# Patient Record
Sex: Male | Born: 1970 | Race: Black or African American | Hispanic: No | Marital: Single | Smoking: Current some day smoker
Health system: Southern US, Community
[De-identification: ages and names within clinical notes are randomized; demographics above are authoritative.]

## PROBLEM LIST (undated history)

## (undated) DIAGNOSIS — E860 Dehydration: Secondary | ICD-10-CM

---

## 2016-01-26 ENCOUNTER — Emergency Department (HOSPITAL_COMMUNITY)
Admission: EM | Admit: 2016-01-26 | Discharge: 2016-01-26 | Disposition: A | Discharge status: Home / Self Care | Payer: PRIVATE HEALTH INSURANCE | Attending: Emergency Medicine | Admitting: Emergency Medicine

## 2016-01-26 ENCOUNTER — Encounter (HOSPITAL_COMMUNITY): Payer: Self-pay | Admitting: *Deleted

## 2016-01-26 ENCOUNTER — Emergency Department (HOSPITAL_COMMUNITY): Payer: PRIVATE HEALTH INSURANCE

## 2016-01-26 DIAGNOSIS — F17200 Nicotine dependence, unspecified, uncomplicated: Secondary | ICD-10-CM | POA: Insufficient documentation

## 2016-01-26 DIAGNOSIS — K047 Periapical abscess without sinus: Secondary | ICD-10-CM

## 2016-01-26 DIAGNOSIS — R220 Localized swelling, mass and lump, head: Secondary | ICD-10-CM | POA: Diagnosis present

## 2016-01-26 LAB — CBC WITH DIFFERENTIAL/PLATELET
Basophils Absolute: 0 K/uL (ref 0.0–0.1)
Basophils Relative: 0 %
Eosinophils Absolute: 0 K/uL (ref 0.0–0.7)
Eosinophils Relative: 0 %
HCT: 43.7 % (ref 39.0–52.0)
Hemoglobin: 14.5 g/dL (ref 13.0–17.0)
Lymphocytes Relative: 27 %
Lymphs Abs: 1.8 K/uL (ref 0.7–4.0)
MCH: 31.7 pg (ref 26.0–34.0)
MCHC: 33.2 g/dL (ref 30.0–36.0)
MCV: 95.6 fL (ref 78.0–100.0)
Monocytes Absolute: 0.9 K/uL (ref 0.1–1.0)
Monocytes Relative: 14 %
Neutro Abs: 3.8 K/uL (ref 1.7–7.7)
Neutrophils Relative %: 59 %
Platelets: 175 K/uL (ref 150–400)
RBC: 4.57 MIL/uL (ref 4.22–5.81)
RDW: 11.6 % (ref 11.5–15.5)
WBC: 6.5 K/uL (ref 4.0–10.5)

## 2016-01-26 LAB — I-STAT CHEM 8, ED
BUN: 10 mg/dL (ref 6–20)
Calcium, Ion: 1.09 mmol/L — ABNORMAL LOW (ref 1.12–1.23)
Chloride: 104 mmol/L (ref 101–111)
Creatinine, Ser: 1 mg/dL (ref 0.61–1.24)
Glucose, Bld: 79 mg/dL (ref 65–99)
HCT: 45 % (ref 39.0–52.0)
Hemoglobin: 15.3 g/dL (ref 13.0–17.0)
Potassium: 3.8 mmol/L (ref 3.5–5.1)
Sodium: 141 mmol/L (ref 135–145)
TCO2: 23 mmol/L (ref 0–100)

## 2016-01-26 MED ORDER — CLINDAMYCIN PHOSPHATE 600 MG/50ML IV SOLN
600.0000 mg | Freq: Once | INTRAVENOUS | Status: AC
  Administered 2016-01-26: 600 mg via INTRAVENOUS
  Filled 2016-01-26: qty 50

## 2016-01-26 MED ORDER — HYDROCODONE-ACETAMINOPHEN 5-325 MG PO TABS: 2.0000 | ORAL_TABLET | ORAL | Status: AC | PRN

## 2016-01-26 MED ORDER — KETOROLAC TROMETHAMINE 30 MG/ML IJ SOLN
30.0000 mg | Freq: Once | INTRAMUSCULAR | Status: AC
  Administered 2016-01-26: 30 mg via INTRAVENOUS
  Filled 2016-01-26: qty 1

## 2016-01-26 MED ORDER — MORPHINE SULFATE (PF) 4 MG/ML IV SOLN
4.0000 mg | Freq: Once | INTRAVENOUS | Status: AC
  Administered 2016-01-26: 4 mg via INTRAVENOUS
  Filled 2016-01-26: qty 1

## 2016-01-26 MED ORDER — IOPAMIDOL (ISOVUE-300) INJECTION 61%
INTRAVENOUS | Status: AC
  Administered 2016-01-26: 75 mL
  Filled 2016-01-26: qty 75

## 2016-01-26 MED ORDER — DEXAMETHASONE SODIUM PHOSPHATE 10 MG/ML IJ SOLN
20.0000 mg | Freq: Once | INTRAMUSCULAR | Status: AC
  Administered 2016-01-26: 20 mg via INTRAVENOUS
  Filled 2016-01-26: qty 2

## 2016-01-26 NOTE — ED Notes (Signed)
States  He noticed swelling to lower jaw on Monday was seen by his MD and started on antibiotics  Not getting better [facility] and was told to come to the ed

## 2016-01-26 NOTE — ED Provider Notes (Signed)
CSN: [id number]     Arrival date & time 01/26/16  1305 History   First MD Initiated Contact with Patient 01/26/16 1339     Chief Complaint  Patient presents with  . Abscess     (Consider location/radiation/quality/duration/timing/severity/associated sxs/prior Treatment) HPI   Alan Dyer is a 45 y.o M with no significant pmhx who presents to the ED today c/o left jaw swelling and pain. Pt states that 5 days ago he noticed that his left lower jaw was beginning to swell. It then became extremely painful. Pt also reports subjective fevers at home which he has been taking ibuprofen for. Pt states that he saw his PCP for this 3 days ago and was given Clindamycin. Pt states that he has taken 6 doses without relief of huis symptoms. Pt states that the are has gotten bigger and more painful since then. Pt states that he went to [facility] earlier today and was sent here for further evaluation. He denies dysphagia, ST, otalgia, rhinorrhea. Pt has not had recent dental work.   History reviewed. No pertinent past medical history. History reviewed. No pertinent past surgical history. History reviewed. No pertinent family history. Social History  Substance Use Topics  . Smoking status: Current Some Day Smoker  . Smokeless tobacco: None  . Alcohol Use: Yes    Review of Systems  All other systems reviewed and are negative.     Allergies  Review of patient's allergies indicates not on file.  Home Medications   Prior to Admission medications   Not on File   BP 119/88 mmHg  Pulse 85  Temp(Src) 98.2 F (36.8 C) (Oral)  Resp 14  Ht  (1.753 m)  Wt 96.616 kg  BMI 31.44 kg/m2  SpO2 96% Physical Exam  Constitutional: He is oriented to person, place, and time. He appears well-developed and well-nourished. No distress.  HENT:  Head: Normocephalic and atraumatic.  Swelling along left mandibular line with TTP. No fluctuance or drainage. No obvious dental abscess. No trismus. No tonsilar  exudate. No PTA. Uvula midline.   Eyes: Conjunctivae are normal. Right eye exhibits no discharge. Left eye exhibits no discharge. No scleral icterus.  Neck: Neck supple.  Cardiovascular: Normal rate.   Pulmonary/Chest: Effort normal.  Lymphadenopathy:    He has no cervical adenopathy.  Neurological: He is alert and oriented to person, place, and time. Coordination normal.  Skin: Skin is warm and dry. No rash noted. He is not diaphoretic. No erythema. No pallor.  Psychiatric: He has a normal mood and affect. His behavior is normal.  Nursing note and vitals reviewed.   ED Course  Procedures (including critical care time) Labs Review Labs Reviewed  I-STAT CHEM 8, ED - Abnormal; Notable for the following:    Calcium, Ion 1.09 (*)    All other components within normal limits  CBC WITH DIFFERENTIAL/PLATELET    Imaging Review Ct Maxillofacial W/cm  01/26/2016  CLINICAL DATA:  Left jaw swelling for several days, initial encounter EXAM: CT MAXILLOFACIAL WITH CONTRAST TECHNIQUE: Multidetector CT imaging of the maxillofacial structures was performed with intravenous contrast. Multiplanar CT image reconstructions were also generated. A small metallic BB was placed on the right temple in order to reliably differentiate right from left. CONTRAST:  Seventy-five 100 mL ISOVUE-300 IOPAMIDOL (ISOVUE-300) INJECTION 61% COMPARISON:  None. FINDINGS: Bony structures show no acute fracture. There is lucency surrounding the root of the first bicuspid on the left in the mandible. Subcutaneous edema and soft tissue swelling is noted  in this region. This likely represents localized infection. No focal abscess is seen. The orbits and their contents are within normal limits. Paranasal sinuses are unremarkable. No other focal abnormality is seen. IMPRESSION: Lucency surrounding the root of a left mandibular first bicuspid. This likely represents a periapical abscess with some adjacent soft tissue involvement. No  subcutaneous abscess is seen. No other focal abnormality is noted. Electronically Signed   By: Alcide CleverMark  Lukens M.D.   On: 01/26/2016 15:42   I have personally reviewed and evaluated these images and lab results as part of my medical decision-making.   EKG Interpretation None      MDM   Final diagnoses:  Periapical abscess   Otherwise healthy 45 year old male presents the ED complaining of left lower jaw pain and swelling onset a few days ago. He has been taking home clindamycin without improvement. He has taken 2 days worth of the antibiotics. On presentation to ED patient is afebrile and is nontoxic, nonseptic appearing. There is swelling noted to his left mandible but no obvious abscess seen. Patient was given IV clindamycin, Decadron and pain medication with significant symptomatic improvement. CT maxillofacial obtained which reveals lucency surrounding the root of a left mandibular first bicuspid which likely represents a periapical abscess. No focal abscess seen on CT that could be drained by me. No leukocytosis. All lab work within normal limits. Unfortunately it is a holiday weekend and there are no dentists on call. They will not be open for another 3 days as Memorial Day is on Monday. Patient has only been taking 2 days with of his antibiotics so technically he is not quite failed outpatient in about therapy as it would require 3 days with no improvement of symptoms. I discussed this with patient and told him that if by tomorrow after his third day of antibiotics if he did not see any symptomatic improvement to please return to the ED for reevaluation as he will not be able to get in to see a dentist at this time. Otherwise, he may follow-up with dentist on Tuesday when the office opens. Encourage patient to continue taking home clindamycin. Will prescribe pain medications.  I discussed this with Dr. Cecille PoMeisner who agrees with the treatment plan.     Lester KinsmanSamantha Tripp KremlinDowless, PA-C 01/26/16  1958  Glynn OctaveStephen Rancour, MD 01/26/16 2138

## 2016-01-26 NOTE — ED Notes (Signed)
Declined W/C at D/C and was escorted to lobby by RN. 

## 2016-01-26 NOTE — ED Notes (Signed)
Patient transported to CT 

## 2016-01-26 NOTE — Discharge Instructions (Signed)
Dental Abscess A dental abscess is a collection of pus in or around a tooth. CAUSES This condition is caused by a bacterial infection around the root of the tooth that involves the inner part of the tooth (pulp). It may result from:  Severe tooth decay.  Trauma to the tooth that allows bacteria to enter into the pulp, such as a broken or chipped tooth.  Severe gum disease around a tooth. SYMPTOMS Symptoms of this condition include:  Severe pain in and around the infected tooth.  Swelling and redness around the infected tooth, in the mouth, or in the face.  Tenderness.  Pus drainage.  Bad breath.  Bitter taste in the mouth.  Difficulty swallowing.  Difficulty opening the mouth.  Nausea.  Vomiting.  Chills.  Swollen neck glands.  Fever. DIAGNOSIS This condition is diagnosed with examination of the infected tooth. During the exam, your dentist may tap on the infected tooth. Your dentist will also ask about your medical and dental history and may order X-rays. TREATMENT This condition is treated by eliminating the infection. This may be done with:  Antibiotic medicine.  A root canal. This may be performed to save the tooth.  Pulling (extracting) the tooth. This may also involve draining the abscess. This is done if the tooth cannot be saved. HOME CARE INSTRUCTIONS  Take medicines only as directed by your dentist.  If you were prescribed antibiotic medicine, finish all of it even if you start to feel better.  Rinse your mouth (gargle) often with salt water to relieve pain or swelling.  Do not drive or operate heavy machinery while taking pain medicine.  Do not apply heat to the outside of your mouth.  Keep all follow-up visits as directed by your dentist. This is important. SEEK MEDICAL CARE IF:  Your pain is worse and is not helped by medicine. SEEK IMMEDIATE MEDICAL CARE IF:  You have a fever or chills.  Your symptoms suddenly get worse.  You have a  very bad headache.  You have problems breathing or swallowing.  You have trouble opening your mouth.  You have swelling in your neck or around your eye.   This information is not intended to replace advice given to you by your health care provider. Make sure you discuss any questions you have with your health care provider.   Follow-up with a dentist as soon as possible for reevaluation. The earliest you will be able to see one is Tuesday as it is a holiday weekend. Continue taking her home antibiotics. Take pain medication as needed. Please return to the emergency department in 2 days if your symptoms have worsened in any way or have they have not improved. You will require a reevaluation and possible antibiotic change if your symptoms have remained unimproved. Return to the ED sooner if you experience fevers, chills, difficulty breathing, difficulty swallowing, swelling of lip or tongue.

## 2016-01-28 ENCOUNTER — Emergency Department (HOSPITAL_COMMUNITY)
Admission: EM | Admit: 2016-01-28 | Discharge: 2016-01-28 | Disposition: A | Discharge status: Home / Self Care | Payer: PRIVATE HEALTH INSURANCE | Attending: Emergency Medicine | Admitting: Emergency Medicine

## 2016-01-28 ENCOUNTER — Encounter (HOSPITAL_COMMUNITY): Payer: Self-pay | Admitting: Emergency Medicine

## 2016-01-28 DIAGNOSIS — K0889 Other specified disorders of teeth and supporting structures: Secondary | ICD-10-CM | POA: Diagnosis present

## 2016-01-28 DIAGNOSIS — K047 Periapical abscess without sinus: Secondary | ICD-10-CM | POA: Diagnosis not present

## 2016-01-28 DIAGNOSIS — Z880 Allergy status to penicillin: Secondary | ICD-10-CM | POA: Diagnosis not present

## 2016-01-28 DIAGNOSIS — F17200 Nicotine dependence, unspecified, uncomplicated: Secondary | ICD-10-CM | POA: Diagnosis not present

## 2016-01-28 MED ORDER — OXYCODONE-ACETAMINOPHEN 5-325 MG PO TABS: 1.0000 | ORAL_TABLET | ORAL | Status: AC | PRN

## 2016-01-28 MED ORDER — OXYCODONE-ACETAMINOPHEN 5-325 MG PO TABS
1.0000 | ORAL_TABLET | Freq: Once | ORAL | Status: AC
  Administered 2016-01-28: 1 via ORAL
  Filled 2016-01-28: qty 1

## 2016-01-28 MED ORDER — OXYCODONE-ACETAMINOPHEN 7.5-325 MG PO TABS: 1.0000 | ORAL_TABLET | Freq: Once | ORAL | Status: DC

## 2016-01-28 NOTE — ED Notes (Addendum)
Pt c/o left lower toothache onset last Monday. Pt seen here Saturday for same. Pt reports prescription medication not working. Pt also reports heartburn after eating catfish and macaroni and cheese yesterday. Pt reports drank ginger ale with relief.

## 2016-01-28 NOTE — Discharge Instructions (Signed)
Dental Abscess °A dental abscess is a collection of pus in or around a tooth. °CAUSES °This condition is caused by a bacterial infection around the root of the tooth that involves the inner part of the tooth (pulp). It may result from: °· Severe tooth decay. °· Trauma to the tooth that allows bacteria to enter into the pulp, such as a broken or chipped tooth. °· Severe gum disease around a tooth. °SYMPTOMS °Symptoms of this condition include: °· Severe pain in and around the infected tooth. °· Swelling and redness around the infected tooth, in the mouth, or in the face. °· Tenderness. °· Pus drainage. °· Bad breath. °· Bitter taste in the mouth. °· Difficulty swallowing. °· Difficulty opening the mouth. °· Nausea. °· Vomiting. °· Chills. °· Swollen neck glands. °· Fever. °DIAGNOSIS °This condition is diagnosed with examination of the infected tooth. During the exam, your dentist may tap on the infected tooth. Your dentist will also ask about your medical and dental history and may order X-rays. °TREATMENT °This condition is treated by eliminating the infection. This may be done with: °· Antibiotic medicine. °· A root canal. This may be performed to save the tooth. °· Pulling (extracting) the tooth. This may also involve draining the abscess. This is done if the tooth cannot be saved. °HOME CARE INSTRUCTIONS °· Take medicines only as directed by your dentist. °· If you were prescribed antibiotic medicine, finish all of it even if you start to feel better. °· Rinse your mouth (gargle) often with salt water to relieve pain or swelling. °· Do not drive or operate heavy machinery while taking pain medicine. °· Do not apply heat to the outside of your mouth. °· Keep all follow-up visits as directed by your dentist. This is important. °SEEK MEDICAL CARE IF: °· Your pain is worse and is not helped by medicine. °SEEK IMMEDIATE MEDICAL CARE IF: °· You have a fever or chills. °· Your symptoms suddenly get worse. °· You have a  very bad headache. °· You have problems breathing or swallowing. °· You have trouble opening your mouth. °· You have swelling in your neck or around your eye. °  °This information is not intended to replace advice given to you by your health care provider. Make sure you discuss any questions you have with your health care provider. °  °Document Released: 08/18/2005 Document Revised: 01/02/2015 Document Reviewed: 08/15/2014 °Elsevier Interactive Patient Education ©2016 Elsevier Inc. °Community Resource Guide Dental °The United Way’s “211” is a great source of information about community services available.  Access by dialing 2-1-1 from anywhere in [place], or by website -  www.nc211.org.  ° °Other Local Resources (Updated 09/2015) ° °Dental  Care °  °Services ° °  °Phone Number and Address  °Cost  °[facility] For children 0 - 21 years of age:  °• Cleaning °• Tooth brushing/flossing instruction °• Sealants, fillings, crowns °• Extractions °• Emergency treatment  [phone] °[address] °[place], [place] [address] Charges based on family income.  Medicaid and some insurance plans accepted.   °  °[facility] [place] • Cleaning °• Sealants, fillings, crowns °• Extractions °• Emergency treatment [phone] °[address] °[place], [place] ° Pregnant women 18 years of age or older with a Medicaid card  °[facility] [place] • Cleaning °• Sealants, fillings, crowns °• Extractions °• Emergency treatment [phone] °[address] °[place], [place] Pregnant   women 18 years of age or older with a Medicaid card  °[facility] For children 0 - 21 years of age:  °• Cleaning °• Tooth brushing/flossing instruction °• Sealants, fillings, crowns °• Extractions °• Emergency treatment °Limited orthodontic services for patients with Medicaid [phone] °[address] °[place], [place] [address] Medicaid and [place] Health Choice cover for children up to age 21 and pregnant women.  Parents of children up to age 21 without Medicaid pay a reduced fee at time of service.  °[facility] [place] For children 0 - 21 years of age:  °• Cleaning °• Tooth brushing/flossing instruction °• Sealants, fillings, crowns °• Extractions °• Emergency treatment °Limited orthodontic services for patients with Medicaid [phone] °[address] °[place], [place].  Medicaid and [place] Health Choice cover for children up to age 21 and pregnant women.  Parents of children up to age 21 without Medicaid pay a reduced fee.  °[facility] • Cleaning °• Sealants, fillings, crowns °• Extractions ° °Hours: Tuesdays and Thursdays, 4:15 - 8 pm [phone] °[address] °[place], [place] [address] Services free of charge to  County residents ages 18-64 who do not have health insurance, Medicare, Medicaid, or VA benefits and fall within federal poverty guidelines  °[facility] ° ° ° Provides dental care in addition to primary medical care, nutritional counseling, and pharmacy: °• Cleaning °• Sealants, fillings, crowns °• Extractions ° ° ° ° ° ° ° ° ° ° ° ° ° ° ° ° ° [phone] °[facility], [address] °[place], [place] ° °[phone] °[facility], [address] [place], [place] ° °[phone] °[facility] °[place], [place] ° °[phone] °[facility], [address] °, [place] ° °[phone] °[facility] °[address] °[place], [place] Accepts Medicaid, Medicare, most insurance.  Also provides services available to all with fees adjusted based on ability to pay.    °[facility] • Cleaning °• Tooth brushing/flossing  instruction °• Sealants, fillings, crowns °• Extractions °• Emergency treatment °Hours: Tuesdays, Thursdays, and Fridays from 8 am to 5 pm by appointment only. [phone] °[address] °[place], [place] [address] [place] residents with Medicaid (depending on eligibility) and children with [place] Health Choice - call for more information.  °[facility] • Extractions only ° °Hours: 2nd and 4th Thursday of each month from 6:30 am - 9 am.   [phone] ext. [phone] °[address] °[place],  [address] Ages 18 and older only.  Patients are seen on a first come, first served basis.  °[facility] • Cleanings °• Fillings °• Extractions °• Orthodontics °• Endodontics °• Implants/Crowns/Bridges °• Complete and partial dentures [phone] °[place],  Patients must complete an application for services.  There is often a waiting list.   ° °

## 2016-01-28 NOTE — ED Notes (Signed)
Declined W/C at D/C and was escorted to lobby by RN. 

## 2016-01-28 NOTE — ED Provider Notes (Signed)
CSN: [id number]     Arrival date & time 01/28/16  4098 History   First MD Initiated Contact with Patient 01/28/16 [phone]     Chief Complaint  Patient presents with  . Dental Pain   HPI   45 year old male presents to the ED with complaints of dental abscess. Patient was seen on 527 in the emergency room after 5 days of swelling. Patient had been seen by his primary care was started on clindamycin, was seen in the ED had a CT scan that showed soft tissue swelling, no abscess. Patient has continued to use clindamycin, pain medication prescribed here in the ED. Patient reports that the swelling has decreased, but notes that he has worsening swelling over the tooth. Patient denies any fever, nausea, vomiting, swelling of the tongue or throat. Patient has not had any recent dental work.  History reviewed. No pertinent past medical history. History reviewed. No pertinent past surgical history. No family history on file. Social History  Substance Use Topics  . Smoking status: Current Some Day Smoker  . Smokeless tobacco: None  . Alcohol Use: Yes    Review of Systems  All other systems reviewed and are negative.   Allergies  Penicillins  Home Medications   Prior to Admission medications   Medication Sig Start Date End Date Taking? Authorizing Provider  HYDROcodone-acetaminophen (NORCO/VICODIN) 5-325 MG tablet Take 2 tablets by mouth every 4 (four) hours as needed. 01/26/16   Samantha Tripp Dowless, PA-C  oxyCODONE-acetaminophen (PERCOCET) 5-325 MG tablet Take 1 tablet by mouth every 4 (four) hours as needed for severe pain. 01/28/16   Kamela Blansett, PA-C   BP 109/63 mmHg  Pulse 63  Temp(Src) 98.8 F (37.1 C) (Oral)  Resp 16  Ht  (1.753 m)  Wt 96.616 kg  BMI 31.44 kg/m2  SpO2 100% Physical Exam  Constitutional: He is oriented to person, place, and time. He appears well-developed and well-nourished. No distress.  HENT:  Head: Normocephalic.  Mouth/Throat: Uvula is midline,  oropharynx is clear and moist and mucous membranes are normal. No oropharyngeal exudate, posterior oropharyngeal edema, posterior oropharyngeal erythema or tonsillar abscesses.  Dental abscess located on the mandible left side. Full active range of motion of the jaw. Neck is supple with full active range of motion, no tenderness to palpation of the soft tissues  Posterior oropharynx clear with no signs of infection, uvula is midline and rises with phonation, tonsils present and normal in size, symmetrical bilateral, tongue is normal soft touch with full active range of motion, floor mouth is soft nontender.  Eyes: Conjunctivae are normal. Pupils are equal, round, and reactive to light. Right eye exhibits no discharge. Left eye exhibits no discharge.  Neck: Normal range of motion. Neck supple. No JVD present. No tracheal deviation present. No thyromegaly present.  Pulmonary/Chest: No stridor.  Lymphadenopathy:    He has no cervical adenopathy.  Neurological: He is alert and oriented to person, place, and time.  Skin: Skin is warm and dry. No rash noted. He is not diaphoretic. No erythema. No pallor.  Psychiatric: He has a normal mood and affect. His behavior is normal. Judgment and thought content normal.  Nursing note and vitals reviewed.   ED Course  Procedures (including critical care time)  EMERGENCY DEPARTMENT US SOFT TISSUE INTERPRETATION "Study: Limited Ultrasound of the noted body part in comments below"  INDICATIONS: Soft tissue infection Multiple views of the body part are obtained with a multi-frequency linear probe  PERFORMED BY:  Myself  IMAGES ARCHIVED?: Yes  SIDE:Left  BODY PART:Other soft tisse (comment in note)  FINDINGS: Abcess present  LIMITATIONS:  Body Habitus  INTERPRETATION:  Abcess present  COMMENT:  Abscess noted  NERVE BLOCK Performed by: Thermon LeylandHedges,Davelle Anselmi Todd Consent: Verbal consent obtained. Required items: required blood products, implants,  devices, and special equipment available Time out: Immediately prior to procedure a "time out" was called to verify the correct patient, procedure, equipment, support staff and site/side marked as required.  Indication: Dental abscess  Nerve block body site: Inferior alveolar left   Preparation: Patient was prepped and draped in the usual sterile fashion. Needle gauge: 24 G Location technique: anatomical landmarks  Local anesthetic: Bupivacaine   Anesthetic total: 1.8 ml  Outcome: pain improved Patient tolerance: Patient tolerated the procedure well with no immediate complications.  INCISION AND DRAINAGE Performed by: Thermon LeylandHedges,Kaci Freel Todd Consent: Verbal consent obtained. Risks and benefits: risks, benefits and alternatives were discussed Type: abscess  Body area: Left mandible  Anesthesia: local infiltration  Incision was made with a scalpel.  Local anesthetic: Bupivacaine % 0 epinephrine  Anesthetic total: 1.8 ml  Complexity: complex Blunt dissection to break up loculations  Drainage: purulent  Drainage amount: 2 mL    Patient tolerance: Patient tolerated the procedure well with no immediate complications.   Labs Review Labs Reviewed - No data to display  Imaging Review Ct Maxillofacial W/cm  01/26/2016  CLINICAL DATA:  Left jaw swelling for several days, initial encounter EXAM: CT MAXILLOFACIAL WITH CONTRAST TECHNIQUE: Multidetector CT imaging of the maxillofacial structures was performed with intravenous contrast. Multiplanar CT image reconstructions were also generated. A small metallic BB was placed on the right temple in order to reliably differentiate right from left. CONTRAST:  Seventy-five 100 mL ISOVUE-300 IOPAMIDOL (ISOVUE-300) INJECTION 61% COMPARISON:  None. FINDINGS: Bony structures show no acute fracture. There is lucency surrounding the root of the first bicuspid on the left in the mandible. Subcutaneous edema and soft tissue swelling is noted in this  region. This likely represents localized infection. No focal abscess is seen. The orbits and their contents are within normal limits. Paranasal sinuses are unremarkable. No other focal abnormality is seen. IMPRESSION: Lucency surrounding the root of a left mandibular first bicuspid. This likely represents a periapical abscess with some adjacent soft tissue involvement. No subcutaneous abscess is seen. No other focal abnormality is noted. Electronically Signed   By: Alcide CleverMark  Lukens M.D.   On: 01/26/2016 15:42   I have personally reviewed and evaluated these images and lab results as part of my medical decision-making.   EKG Interpretation None      MDM   Final diagnoses:  Pain, dental  Dental abscess    Labs:  Imaging:  Consults:  Therapeutics:  Discharge Meds: Continue clindamycin  Assessment/Plan:  Patient presents with dental abscess. Patient has CT scan showed no appreciable abscess, bedside ultrasound shows drainable abscess. Upon performed, abscess drained with purulent discharge. Patient improved here with above medications and I&D. He is instructed to continue his clindamycin, pain medication as needed. He is instructed to follow-up with the dentist as previously planned, he is given strict return precautions, verbalized understanding and agreement today's plan had no further questions or concerns at time of discharge        Eyvonne MechanicJeffrey Adama Ferber, PA-C 01/28/16 1035  Rolland PorterMark James, MD 02/07/16 1750

## 2016-12-30 IMAGING — CT CT MAXILLOFACIAL W/ CM
3 series · 16 of 47 positions shown, 19 images · IV contrast (iopamidol)
Comparison: None.

CLINICAL DATA: Left jaw swelling for several days, initial
encounter

EXAM:
CT MAXILLOFACIAL WITH CONTRAST
TECHNIQUE: Multidetector CT imaging of the maxillofacial structures was
performed with intravenous contrast. Multiplanar CT image
reconstructions were also generated. A small metallic BB was placed
on the right temple in order to reliably differentiate right from
left.
CONTRAST:  Seventy-five 100 mL JVBI5J-ATT IOPAMIDOL (JVBI5J-ATT)
INJECTION 61%

[Series 2: facial/ orbits 2.0 h30s · axial · 0.39mm/px · z∈[+554,+732]mm · 10 of 105 slices shown, 13 images]
[im 8/105  brain]
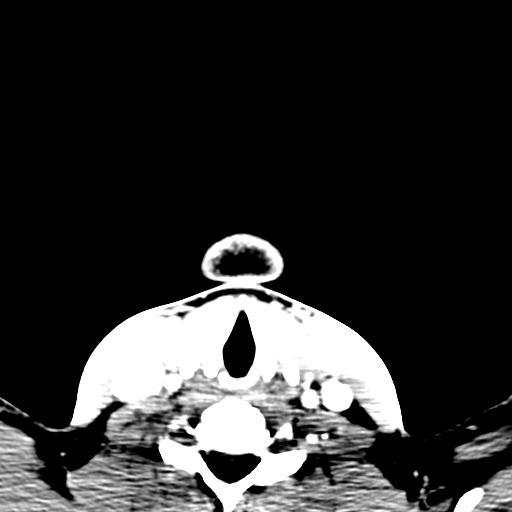
[im 8/105  bone]
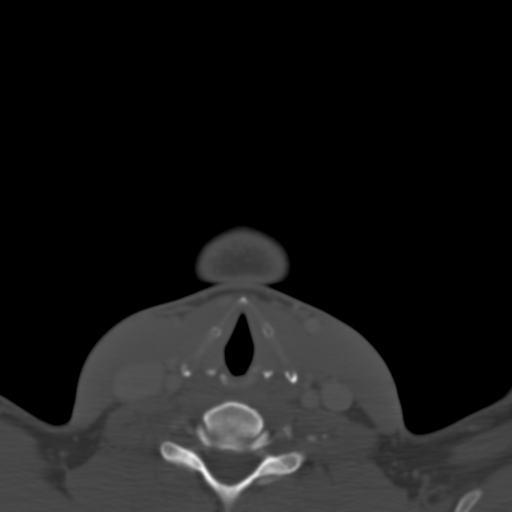
[im 18/105  bone]
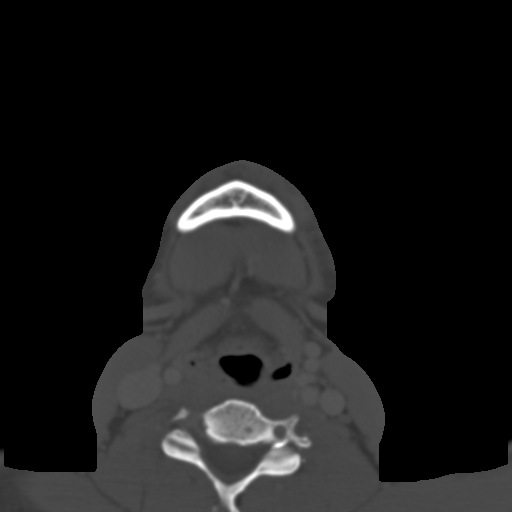
[im 29/105  bone]
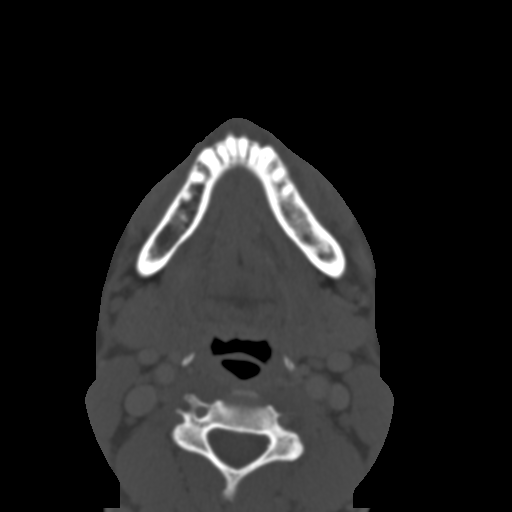
[im 36/105  bone]
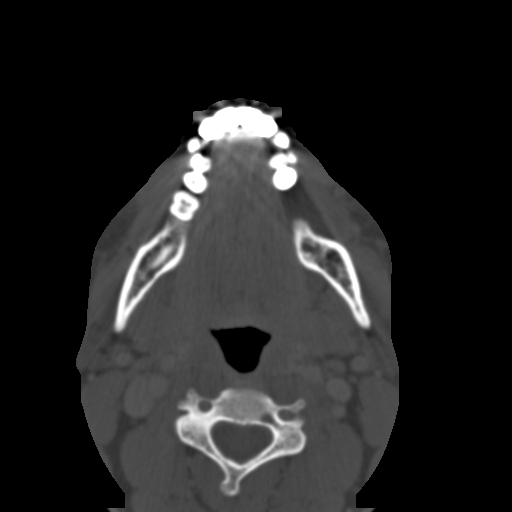
[im 47/105  brain]
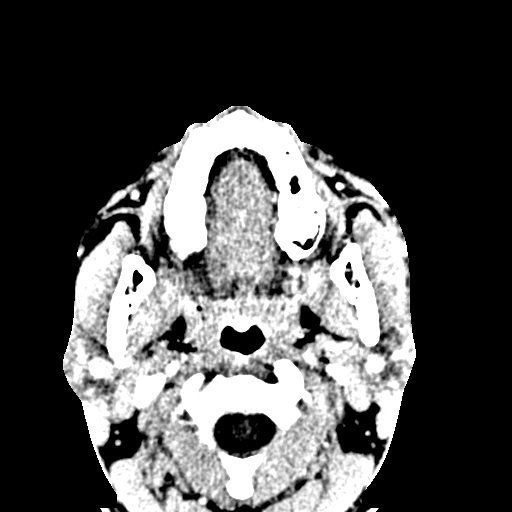
[im 47/105  bone]
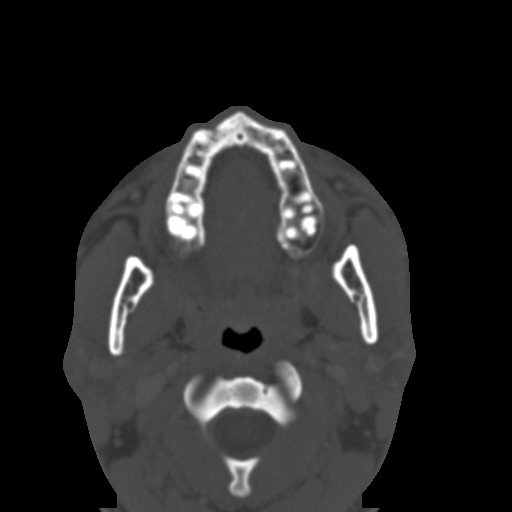
[im 58/105  bone]
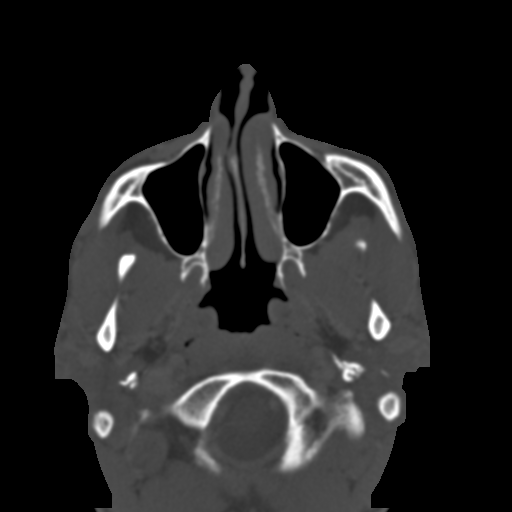
[im 69/105  bone]
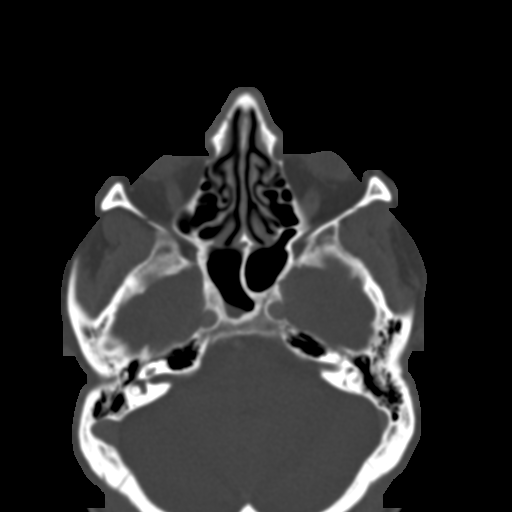
[im 79/105  bone]
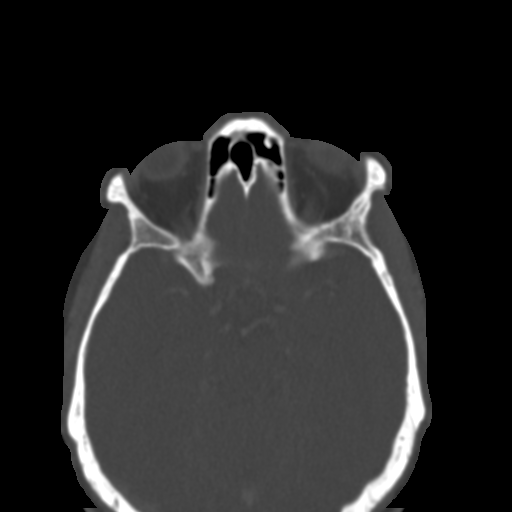
[im 87/105  brain]
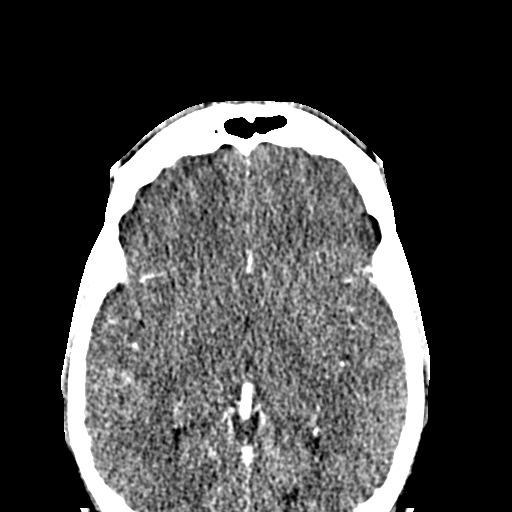
[im 87/105  bone]
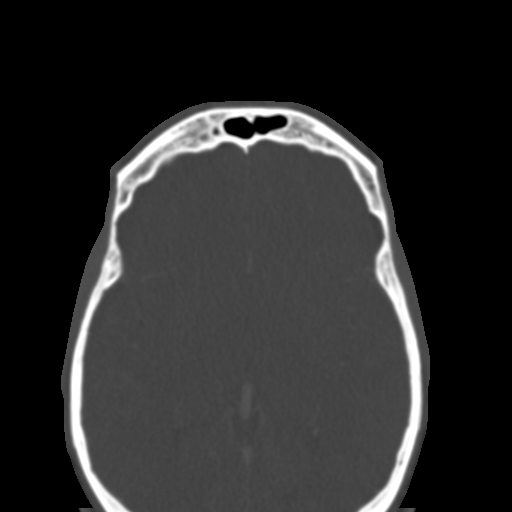
[im 97/105  bone]
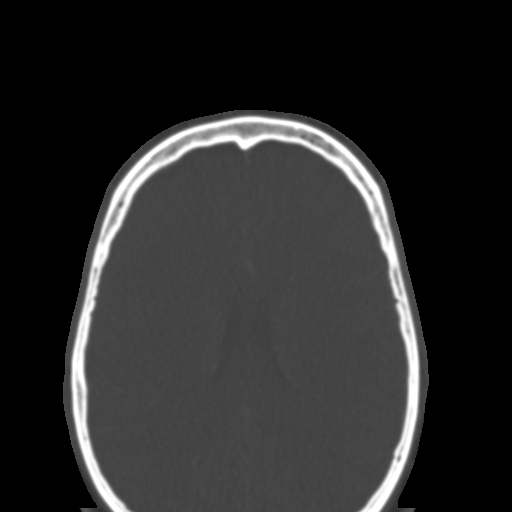

[Series 6: coronal soft tissue · coronal · 0.38mm/px · 3 of 97 slices shown]
[im 33/97  bone]
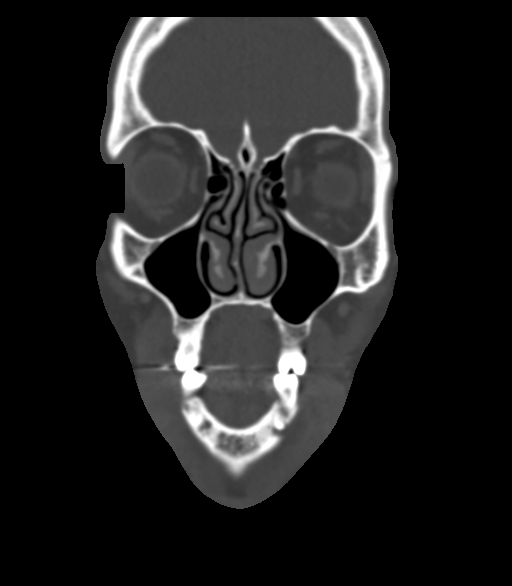
[im 43/97  bone]
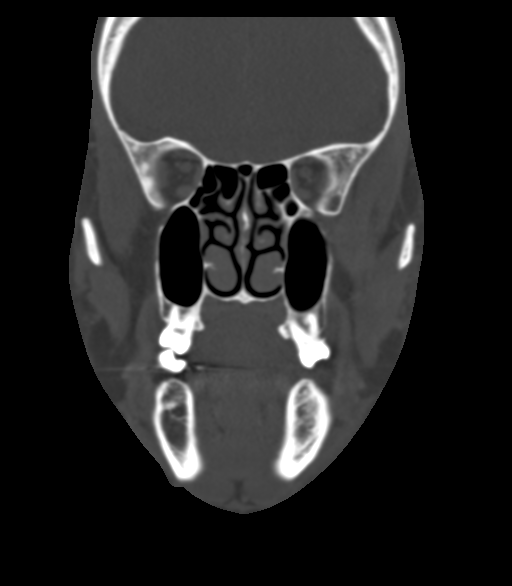
[im 54/97  bone]
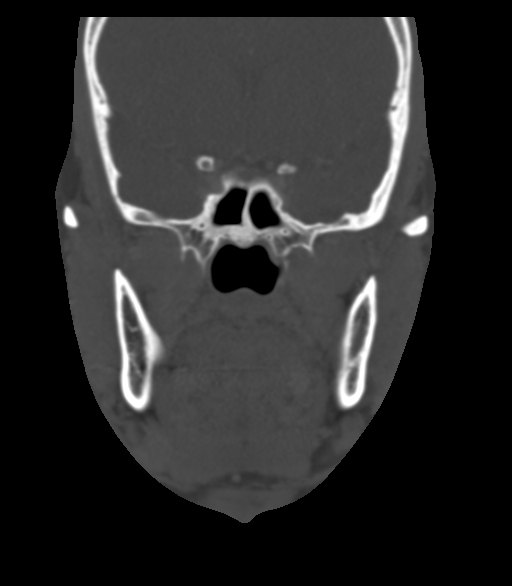

[Series 7: sagittal soft tissue · sagittal · 0.39mm/px · 3 of 81 slices shown]
[im 27/81  bone]
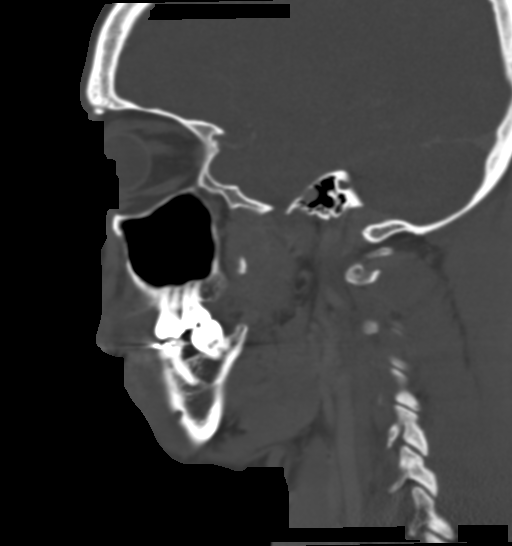
[im 41/81  bone]
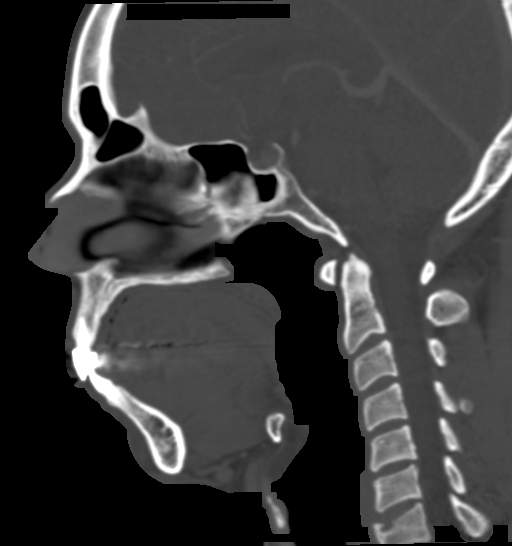
[im 54/81  bone]
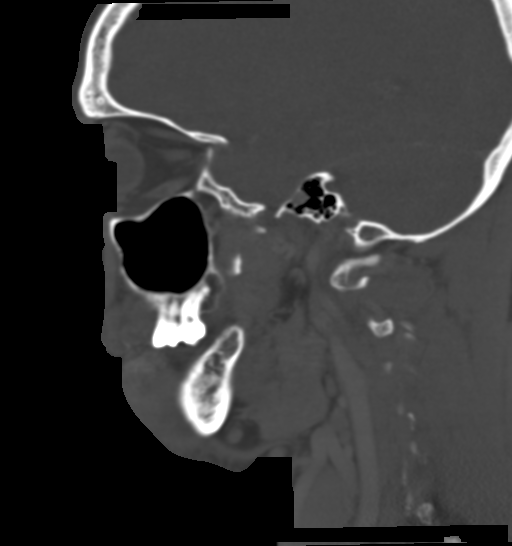

[16 of 47 positions shown; findings below may reference images not displayed]

FINDINGS: Bony structures show no acute fracture. There is lucency surrounding
the root of the first bicuspid on the left in the mandible.
Subcutaneous edema and soft tissue swelling is noted in this region.
This likely represents localized infection. No focal abscess is
seen. The orbits and their contents are within normal limits.
Paranasal sinuses are unremarkable. No other focal abnormality is
seen.
IMPRESSION: Lucency surrounding the root of a left mandibular first bicuspid.
This likely represents a periapical abscess with some adjacent soft
tissue involvement. No subcutaneous abscess is seen. No other focal
abnormality is noted.

## 2017-03-17 ENCOUNTER — Ambulatory Visit
Admit: 2017-03-17 | Discharge: 2017-03-17 | Payer: BLUE CROSS/BLUE SHIELD | Attending: Family Medicine | Primary: Family Medicine

## 2017-03-17 DIAGNOSIS — L723 Sebaceous cyst: Secondary | ICD-10-CM

## 2017-03-17 MED ORDER — TRIMETHOPRIM-SULFAMETHOXAZOLE 160 MG-800 MG TAB
160-800 mg | ORAL_TABLET | Freq: Two times a day (BID) | ORAL | 0 refills | Status: DC
Start: 2017-03-17 — End: 2017-04-28

## 2017-03-17 NOTE — Progress Notes (Signed)
Chief Complaint   Patient presents with   ??? Cyst     Poss. Lt side of face     SUBJECTIVE     HPI:  Jeremy Bowers is a 46 y.o. male presents for the following:      A knot or something that popped up on the left side of the face lateral to the eye that was sore and red over the weekend.  It has gone down a lot on it's own since then and is no longer sore.      Also, he has additional complaints of chronic insomnia, difficulty falling asleep and staying asleep.        Allergies   Allergen Reactions   ??? Pcn [Penicillins] Anaphylaxis       History reviewed. No pertinent past medical history.  Past Surgical History:   Procedure Laterality Date   ??? HX APPENDECTOMY       Social History     Social History   ??? Marital status: SINGLE     Spouse name: N/A   ??? Number of children: N/A   ??? Years of education: N/A     Occupational History   ??? Not on file.     Social History Main Topics   ??? Smoking status: Current Every Day Smoker   ??? Smokeless tobacco: Never Used   ??? Alcohol use Yes      Comment: Occasionally   ??? Drug use: Not on file   ??? Sexual activity: Not on file     Other Topics Concern   ??? Not on file     Social History Narrative   ??? No narrative on file       ROS    Constitutional: Negative for fever and chills.   Respiratory: Negative for cough, shortness of breath and wheezing.   Cardiovascular: Negative for chest pain  Gastrointestinal: Negative for nausea, vomiting, abdominal pain, diarrhea and constipation.          OBJECTIVE     PHYSICAL EXAM  Visit Vitals   ??? BP 124/70 (BP 1 Location: Left arm, BP Patient Position: Sitting)   ??? Pulse 76   ??? Temp 98 ??F (36.7 ??C) (Oral)   ??? Ht 5' 8.7" (1.745 m)   ??? Wt 228 lb (103.4 kg)   ??? BMI 33.96 kg/m2     CONSTITUTIONAL - well developed, well nourished, in no distress.  SKIN - warm and dry, what looks like a resolving sebaceous cyst on the left face without erythema or tenderness or fluctuance  EYE - conjunctiva is clear, anicteric sclera   NECK - no masses, lymphadenopathy or thyroid abnormality.  CHEST - clear to auscultation.  Effort normal.  CARDIAC - regular rate and rhythm. No murmur,rubs or gallop  ABDOMEN - no tenderness  EXTREMITIES - no edema  NEUROLOGICAL - alert, oriented  PSYCHIATRIC - normal mood and appropriate affect.        ASSESSMENT / PLAN     Diagnoses and all orders for this visit:    1. Sebaceous cyst  -     trimethoprim-sulfamethoxazole (BACTRIM DS, SEPTRA DS) 160-800 mg per tablet; Take 1 Tab by mouth two (2) times a day.  -     Only fill Rx if worsens again and follow up with me.     2. Insomnia, unspecified type        -     Sleep hygiene discussed.     Schedule CPE.  Golden Hurter, M.D.    [facility]  [address] [place] [address]

## 2017-04-28 ENCOUNTER — Ambulatory Visit
Admit: 2017-04-28 | Discharge: 2017-04-28 | Payer: BLUE CROSS/BLUE SHIELD | Attending: Family Medicine | Primary: Family Medicine

## 2017-04-28 DIAGNOSIS — Z0000 Encounter for general adult medical examination without abnormal findings: Secondary | ICD-10-CM

## 2017-04-28 LAB — URINALYSIS W/MICROSCOPIC
Bacteria: NEGATIVE /HPF
Bilirubin: NEGATIVE mg/dL
Glucose: NEGATIVE mg/dL
Ketone: NEGATIVE mg/dL
Leukocyte Esterase: NEGATIVE LEU/uL
Nitrites: NEGATIVE
Protein: NEGATIVE mg/dL
Specific gravity: 1.02 (ref 1.000–1.030)
URINE EPITHELIAL CELLS: NEGATIVE /HPF
Urobilinogen: 0.2 E.U./dl (ref 0.2–1.0)
WBC: 0 /HPF (ref 0–2)
pH (UA): 6 (ref 5.0–8.0)

## 2017-04-28 LAB — CBC, POC
ABS. GRANULOCYTES: 2.9 10*3/uL (ref 2.0–7.8)
ABS. LYMPHOCYTES: 1.7 10*3/uL (ref 0.7–3.1)
ABS. MONOCYTES: 0.2 10*3/uL (ref 0.1–1.1)
GRANULOCYTES: 60.3 % (ref 37.0–92.0)
HCT: 42.7 % (ref 41.0–54.0)
HGB: 14.1 g/dL (ref 13.5–18.0)
LYMPHOCYTES: 35.5 % (ref 20.5–51.1)
MCH: 32.8 pg (ref 27.0–34.0)
MCHC: 33.1 g/dL (ref 31.0–36.0)
MCV: 99 fL (ref 80–100)
MEAN PLATELET VOLUME: 7.6 fL (ref 6.9–10.4)
MONOCYTES: 4.2 % (ref 3.0–10.0)
PLATELET: 201 10*3/uL (ref 140–440)
RBC: 4.31 M/uL — ABNORMAL LOW (ref 4.40–6.20)
RDW: 12.8 % (ref 11.0–16.0)
WBC: 4.8 10*3/uL (ref 4.0–11.0)

## 2017-04-28 NOTE — Progress Notes (Signed)
Chief Complaint   Patient presents with   ??? Complete Physical     SUBJECTIVE       Jeremy Bowers is a 46 y.o. male presents for Complete Physical    HPI:       Allergies   Allergen Reactions   ??? Pcn [Penicillins] Anaphylaxis       History reviewed. No pertinent past medical history.  Past Surgical History:   Procedure Laterality Date   ??? HX APPENDECTOMY  2013   ??? HX HERNIA REPAIR Right     inguinal     Family History   Problem Relation Age of Onset   ??? Diabetes Mother    ??? Hypertension Brother       Social History     Social History   ??? Marital status: SINGLE     Spouse name: N/A   ??? Number of children: N/A   ??? Years of education: N/A     Occupational History   ??? [employer]      Social History Main Topics   ??? Smoking status: Current Every Day Smoker   ??? Smokeless tobacco: Never Used   ??? Alcohol use Yes      Comment: occasional   ??? Drug use: No   ??? Sexual activity: Not Currently     Other Topics Concern   ??? Not on file     Social History Narrative       ROS  Constitutional - No fever or chills.  Skin - No changes in turgor or texture.  No rashes.  No lesions.  Eyes-  No changes in vision.  ENT -  No changes in hearing. No nasal drainage or congestion. No inhalant allergies. No sore throat or hoarseness.  Neck - No pain or swelling  Respiratory -  No cough, wheezing or dyspnea.  Cardiovascular- No chest pain, palpitations or edema.  No orthopnea or PND.  No claudication.  Gastrointestinal - No nausea, vomiting or changes in bowel patterns.  No hematochezia or melena.  No Hx of hepatitis or jaundice.  Genitourinary - No dysuria, hematuria or incontinence.  No nocturia.  No Hx of nephrolithiasis. No Hx of prostate disease.  Musculoskeletal  - No joint swelling or pain. No myalgia.  Hematologic / Oncologic - No bruising or unusual bleeding. No Hx of anemia. No lymphadenopathy.  Neurological  - No seizures or syncope. No paresthesias, tremor or weakness. No headache or confusion.   Psychiatric - Denies depression or anxiety symptoms  Endocrine - No polydipsia or polyuria.  Weight and appetite stable.  No cold or heat intolerance.      OBJECTIVE     PHYSICAL EXAM  Visit Vitals   ??? BP 118/80 (BP 1 Location: Left arm, BP Patient Position: Sitting)   ??? Pulse 72   ??? Temp 98 ??F (36.7 ??C) (Oral)   ??? Ht 5' 8.7" (1.745 m)   ??? Wt 216 lb (98 kg)   ??? BMI 32.18 kg/m2        Visual Acuity Screening    Right eye Left eye Both eyes   Without correction:      With correction: 20/20 20/25 20/20    Comments: Color normal.   DC..................      CONSTITUTIONAL - Well developed, well nourished, in no distress.  SKIN - Normal turgor and texture. No suspicious lesions.  HEAD - Normocephalic.  EYE - PERRL.  EOMI.  Conjunctiva normal.  Sclera anicteric.  ENT - Ear canals  clear and TM`s normal color with distinct landmarks. Nasal membranes normal. No oral or pharyngeal lesions and mucous membranes moist.  NECK - No masses, lymphadenopathy or thyroid abnormality.  CHEST - Clear to auscultation.  Effort normal.  CARDIAC - Regular rate and rhythm. No murmur, gallop, rub or click. No JVD, carotid or abdominal bruits. Peripheral pulses normal and symmetric.  ABDOMEN - Normal bowel sound, soft, non-tender, non-distended. No masses or organomegaly.  GENITALIA - Normal male genitalia. No masses or hernias.  RECTAL - Normal rectum.  Prostate normal size and texture and no nodules.  EXTREMITIES - No joint swelling or deformity.  No edema.  NEUROLOGICAL - Alert and oriented. Gait normal. Cranial nerves grossly intact. No obvious motor or sensory deficits. Deep tendon reflexes normal and symmetric.  PSYCHIATRIC - Normal mood and appropriate affect.  IMMUNOLOGIC - No lymphadenopathy.         ASSESSMENT / PLAN     Diagnoses and all orders for this visit:    1. Well adult exam  -     UA w/ Micro - POC (14103)  -     CBC - POC (01314)  -     CMP (38887)  -     Lipid panel w LDL/HDL ratio (80061)    2. Exposure to STD   -     HIV screen, 4th gen [phone]  -     Hepatitis C  Antibody (20601)  -     GC/ Chlamydia (87491/87591)  -     RPR (56153)  -     HEP B SURFACE AG      Encouraged regular exercise.  Encouraged to quit smoking.      Follow-up Disposition:  Return in about 1 year (around 04/28/2018).        Crista Luria, M.D.    [facility]  [address]  [place], [place] [address]

## 2017-04-29 LAB — HEPATITIS C ANTIBODY: HCV Ab: <0.1 s/co ratio (ref 0.0–0.9)

## 2017-04-29 LAB — HIV 1/2 ANTIGEN/ANTIBODY, FOURTH GENERATION W/RFL: HIV Screen 4th Generation wRfx: NONREACTIVE

## 2017-04-29 LAB — RPR
RPR: NONREACTIVE
RPR: NONREACTIVE

## 2017-04-29 LAB — METABOLIC PANEL, COMPREHENSIVE
A-G Ratio: 2.1 (ref 1.2–2.2)
ALT (SGPT): 18 IU/L (ref 0–44)
AST (SGOT): 18 IU/L (ref 0–40)
Albumin: 4.6 g/dL (ref 3.5–5.5)
Alk. phosphatase: 62 IU/L (ref 39–117)
BUN/Creatinine ratio: 9 (ref 9–20)
BUN: 11 mg/dL (ref 6–24)
Bilirubin, total: 0.6 mg/dL (ref 0.0–1.2)
CO2: 26 mmol/L (ref 20–29)
Calcium: 9.5 mg/dL (ref 8.7–10.2)
Chloride: 102 mmol/L (ref 96–106)
Creatinine: 1.27 mg/dL (ref 0.76–1.27)
GFR est AA: 78 mL/min/{1.73_m2} (ref >59)
GFR est non-AA: 68 mL/min/{1.73_m2} (ref >59)
GLOBULIN, TOTAL: 2.2 g/dL (ref 1.5–4.5)
Glucose: 95 mg/dL (ref 65–99)
Potassium: 5.2 mmol/L (ref 3.5–5.2)
Protein, total: 6.8 g/dL (ref 6.0–8.5)
Sodium: 142 mmol/L (ref 134–144)

## 2017-04-29 LAB — LIPID PANEL WITH LDL/HDL RATIO
Cholesterol, total: 167 mg/dL (ref 100–199)
HDL Cholesterol: 48 mg/dL (ref >39)
LDL, calculated: 103 mg/dL — ABNORMAL HIGH (ref 0–99)
LDL/HDL Ratio: 2.1 ratio (ref 0.0–3.6)
Triglyceride: 78 mg/dL (ref 0–149)
VLDL, calculated: 16 mg/dL (ref 5–40)

## 2017-04-29 LAB — HEP B SURFACE AG: Hep B surface Ag screen: NEGATIVE

## 2017-04-29 LAB — HEPATITIS C AB: Hep C Virus Ab: <0.1 s/co ratio (ref 0.0–0.9)

## 2017-04-29 LAB — HIV 1/2 AG/AB, 4TH GENERATION,W RFLX CONFIRM: HIV SCREEN 4TH GENERATION WRFX: NONREACTIVE

## 2017-04-30 LAB — CHLAMYDIA/GC PCR
Chlamydia trachomatis, NAA: NEGATIVE
Neisseria gonorrhoeae, NAA: NEGATIVE

## 2017-05-12 NOTE — Telephone Encounter (Signed)
Labs results mailed to pt.   DC.................

## 2018-04-26 ENCOUNTER — Encounter: Primary: Family Medicine

## 2018-05-04 ENCOUNTER — Encounter: Attending: Family Medicine | Primary: Family Medicine

## 2023-02-25 ENCOUNTER — Emergency Department: Admit: 2023-02-25 | Payer: PRIVATE HEALTH INSURANCE | Primary: Family Medicine

## 2023-02-25 ENCOUNTER — Inpatient Hospital Stay
Admit: 2023-02-25 | Discharge: 2023-02-25 | Disposition: A | Discharge status: Home / Self Care | Payer: PRIVATE HEALTH INSURANCE

## 2023-02-25 DIAGNOSIS — E860 Dehydration: Secondary | ICD-10-CM

## 2023-02-25 LAB — CBC WITH AUTO DIFFERENTIAL
Basophils %: 1 % (ref 0.0–2.0)
Basophils Absolute: 0 10*3/uL (ref 0.0–0.2)
Eosinophils %: 1 % (ref 0.5–7.8)
Eosinophils Absolute: 0.1 10*3/uL (ref 0.0–0.8)
Hematocrit: 45.1 % (ref 41.1–50.3)
Hemoglobin: 14.8 g/dL (ref 13.6–17.2)
Immature Granulocytes %: 0 % (ref 0.0–5.0)
Immature Granulocytes Absolute: 0 10*3/uL (ref 0.0–0.5)
Lymphocytes %: 37 % (ref 13–44)
Lymphocytes Absolute: 2.3 10*3/uL (ref 0.5–4.6)
MCH: 32.7 PG (ref 26.1–32.9)
MCHC: 32.8 g/dL (ref 31.4–35.0)
MCV: 99.8 FL (ref 82.0–102.0)
MPV: 9.8 FL (ref 9.4–12.3)
Monocytes %: 9 % (ref 4.0–12.0)
Monocytes Absolute: 0.6 10*3/uL (ref 0.1–1.3)
Neutrophils %: 52 % (ref 43–78)
Neutrophils Absolute: 3.3 10*3/uL (ref 1.7–8.2)
Platelets: 251 10*3/uL (ref 150–450)
RBC: 4.52 M/uL (ref 4.23–5.6)
RDW: 12.1 % (ref 11.9–14.6)
WBC: 6.3 10*3/uL (ref 4.3–11.1)
nRBC: 0 10*3/uL (ref 0.0–0.2)

## 2023-02-25 LAB — EKG 12-LEAD
Atrial Rate: 69 {beats}/min
P Axis: 70 degrees
P-R Interval: 189 ms
Q-T Interval: 365 ms
QRS Duration: 86 ms
QTc Calculation (Bazett): 391 ms
R Axis: 78 degrees
T Axis: 66 degrees
Ventricular Rate: 69 {beats}/min

## 2023-02-25 LAB — COMPREHENSIVE METABOLIC PANEL
ALT: 23 U/L (ref 12–65)
AST: 20 U/L (ref 15–37)
Albumin/Globulin Ratio: 1.4 (ref 1.0–1.9)
Albumin: 4.3 g/dL (ref 3.5–5.0)
Alk Phosphatase: 83 U/L (ref 40–129)
Anion Gap: 10 mmol/L (ref 9–18)
BUN: 12 MG/DL (ref 6–23)
CO2: 27 mmol/L (ref 20–28)
Calcium: 9.6 MG/DL (ref 8.8–10.2)
Chloride: 102 mmol/L (ref 98–107)
Creatinine: 1.09 MG/DL (ref 0.80–1.30)
Est, Glom Filt Rate: 83 mL/min/{1.73_m2} (ref >60)
Globulin: 3 g/dL (ref 2.3–3.5)
Glucose: 94 mg/dL (ref 70–99)
Potassium: 4.6 mmol/L (ref 3.5–5.1)
Sodium: 139 mmol/L (ref 136–145)
Total Bilirubin: 0.6 MG/DL (ref 0.0–1.2)
Total Protein: 7.3 g/dL (ref 6.3–8.2)

## 2023-02-25 LAB — CK: Total CK: 164 U/L (ref 21–215)

## 2023-02-25 LAB — POCT URINALYSIS DIPSTICK
Bilirubin, Urine, POC: NEGATIVE
Glucose, UA POC: 100 mg/dL — AB
Ketones, Urine, POC: NEGATIVE mg/dL
Leukocyte Est, UA POC: NEGATIVE
Nitrite, Urine, POC: NEGATIVE
Protein, Urine, POC: NEGATIVE mg/dL
Specific Gravity, Urine, POC: 1.01 (ref 1.001–1.023)
URINE UROBILINOGEN POC: 0.2 EU/dL (ref 0.2–1.0)
pH, Urine, POC: 7 (ref 5.0–9.0)

## 2023-02-25 LAB — TROPONIN
Troponin T: 8 ng/L (ref 0–22)
Troponin T: 7 ng/L (ref 0–22)

## 2023-02-25 MED ORDER — ACETAMINOPHEN 500 MG PO TABS
500 | ORAL | Status: AC
Start: 2023-02-25 — End: 2023-02-25
  Administered 2023-02-25: 18:00:00 1000 mg via ORAL

## 2023-02-25 MED ORDER — SODIUM CHLORIDE 0.9 % IV BOLUS
0.9 | INTRAVENOUS | Status: AC
Start: 2023-02-25 — End: 2023-02-25
  Administered 2023-02-25: 18:00:00 1000 mL via INTRAVENOUS

## 2023-02-25 MED FILL — ACETAMINOPHEN EXTRA STRENGTH 500 MG PO TABS: 500 MG | ORAL | Qty: 2

## 2023-02-25 NOTE — ED Notes (Signed)
Patient mobility status  with no difficulty. Provider aware     I have reviewed discharge instructions with the patient.  The patient verbalized understanding.    Patient left ED via Discharge Method: ambulatory to Home with  family .    Opportunity for questions and clarification provided.     Patient given 0 scripts.

## 2023-02-25 NOTE — Discharge Instructions (Signed)
As discussed your workup here today was reassuring.  Make sure you are drinking plenty of fluids at home especially during times of extreme heat.    Follow-up with your PCP in 1 to 2 days if no improvement.  Return to the ER for any new or worsening symptoms.

## 2023-02-25 NOTE — ED Triage Notes (Signed)
Pt arrives via [facility] from work. Reports lightheaded, HA,nausea. Reports A/C has been out at work. 4 mg PO zofran en route

## 2023-02-25 NOTE — ED Notes (Signed)
Orthostatic Vitals:      02/25/2023     4:58 PM   Orthostatic Vitals   Orthostatic B/P and Pulse? Yes   Blood Pressure Lying 133/79   Pulse Lying 68 PER MINUTE   Blood Pressure Sitting 155/93   Pulse Sitting 68 PER MINUTE   Blood Pressure Standing 140/90   Pulse Standing 72 PER MINUTE     Denies dizziness with position change

## 2023-02-25 NOTE — ED Provider Notes (Signed)
Emergency Department Provider Note       PCP: Crista Luria, MD   Age: 52 y.o.   Sex: male     DISPOSITION Decision To Discharge 02/25/2023 05:11:52 PM       ICD-10-CM    1. Dehydration  E86.0       2. Dizziness  R42           Medical Decision Making     52 year old male presenting to the emergency department today for complaint of dizziness, headache, nausea at work today after he felt like he became overheated.  Vital signs stable on arrival, he was borderline orthostatic on initial evaluation.  He was treated with IV fluids and had resolution of all symptoms.  Workup today was reassuring, CK normal, cardiac workup normal.  Return precautions discussed with patient, he feels comfortable to be discharged  ED Course as of 02/25/23 1716   Wed Feb 25, 2023   1358 Patient borderline orthostatic from sitting to standing.  Will recheck after IV fluids [KE]   1506 Troponin T: 8.0 [KE]   1506 Creatinine: 1.09 [KE]   1506 Total CK: 164 [KE]   1633 Troponin T: 7.0 [KE]   1713 Repeat orthostatics negative and patient states that all symptoms have resolved after IV fluids.  We discussed his reassuring workup here today.  I suspect symptoms due to some mild dehydration.  Encouraged increased p.o. hydration at home and return precautions, patient feels comfortable to be discharged home. [KE]      ED Course User Index  [KE] Tish Frederickson, PA     1 acute, uncomplicated illness or injury.  Shared medical decision making was utilized in creating the patients health plan today.    I independently ordered and reviewed each unique test.  I reviewed external records: ED visit note from an outside group.  I reviewed external records: provider visit note from PCP.     I interpreted the X-rays I reviewed imaging and radiology report, no infiltrate.  My Independent EKG Interpretation: sinus rhythm, no evidence of arrhythmia      ST Segments:Normal ST segments - NO STEMI   Rate: 23            History     52 year old male with no  underlying past medical history presenting to the emergency department today with complaint of lightheadedness, headache, nausea.  Onset about 2 hours prior to arrival at the emergency department today.  He was at work today and he reports that the air conditioner was not working and he was very hot in his office, he was having some eating so he had to have the door closed most of the time which did not help.  At 1 point he tried to go outside and smoke a cigarette, states that he was standing in the shade but he continued to feel overheated.  When he walked back inside he started feeling very lightheaded and dizzy, short of breath and nauseous.  He denies any chest pain.  He has not had any vomiting.  He denies any fevers, chills or recent illness.      The history is provided by the patient. No language interpreter was used.     Physical Exam     Vitals signs and nursing note reviewed:  Vitals:    02/25/23 1322 02/25/23 1500   BP: 110/64 112/64   Pulse: 72 65   Resp: 16 18   Temp: 99.1 F (37.3 C)  TempSrc: Oral    SpO2: 98% 99%   Weight: 99.8 kg (220 lb)    Height: 1.727 m (5\' 8" )       Physical Exam  Vitals and nursing note reviewed.   Constitutional:       General: He is not in acute distress.     Appearance: Normal appearance.   HENT:      Head: Normocephalic and atraumatic.      Nose: Nose normal.   Eyes:      Conjunctiva/sclera: Conjunctivae normal.   Cardiovascular:      Rate and Rhythm: Normal rate and regular rhythm.      Pulses: Normal pulses.   Pulmonary:      Effort: Pulmonary effort is normal.      Breath sounds: No wheezing, rhonchi or rales.   Abdominal:      Palpations: Abdomen is soft.      Tenderness: There is no abdominal tenderness. There is no guarding.   Skin:     General: Skin is warm and dry.      Capillary Refill: Capillary refill takes less than 2 seconds.   Neurological:      General: No focal deficit present.      Mental Status: He is alert and oriented to person, place, and time.    Psychiatric:         Mood and Affect: Mood normal.         Thought Content: Thought content normal.         Judgment: Judgment normal.        Procedures     Procedures    Orders Placed This Encounter   Procedures    XR CHEST (2 VW)    CBC with Auto Differential    CMP    CK    Troponin    Urinalysis, Micro    POCT Urine Dipstick    Orthostatic blood pressure and pulse    POCT Urinalysis no Micro    EKG 12 Lead        Medications given during this emergency department visit:  Medications   sodium chloride 0.9 % bolus 1,000 mL (1,000 mLs IntraVENous New Bag 02/25/23 1410)   acetaminophen (TYLENOL) tablet 1,000 mg (1,000 mg Oral Given 02/25/23 1411)       New Prescriptions    No medications on file        No past medical history on file.     No past surgical history on file.     Social History     Socioeconomic History    Marital status: Single        Previous Medications    No medications on file        Results for orders placed or performed during the hospital encounter of 02/25/23   XR CHEST (2 VW)    Narrative    Chest X-ray    INDICATION: Dizziness, nausea    COMPARISON:  None    TECHNIQUE: PA and lateral views of the chest were obtained.    FINDINGS: The lungs are clear. There are no infiltrates or effusions.  The heart  size is normal.  The bony thorax is intact.        Impression    No acute findings in the chest      Electronically signed by Gifford Shave   CBC with Auto Differential   Result Value Ref Range    WBC 6.3 4.3 - 11.1 K/uL  RBC 4.52 4.23 - 5.6 M/uL    Hemoglobin 14.8 13.6 - 17.2 g/dL    Hematocrit 45.4 09.8 - 50.3 %    MCV 99.8 82.0 - 102.0 FL    MCH 32.7 26.1 - 32.9 PG    MCHC 32.8 31.4 - 35.0 g/dL    RDW 11.9 14.7 - 82.9 %    Platelets 251 150 - 450 K/uL    MPV 9.8 9.4 - 12.3 FL    nRBC 0.00 0.0 - 0.2 K/uL    Differential Type AUTOMATED      Neutrophils % 52 43 - 78 %    Lymphocytes % 37 13 - 44 %    Monocytes % 9 4.0 - 12.0 %    Eosinophils % 1 0.5 - 7.8 %    Basophils % 1 0.0 - 2.0 %     Immature Granulocytes % 0 0.0 - 5.0 %    Neutrophils Absolute 3.3 1.7 - 8.2 K/UL    Lymphocytes Absolute 2.3 0.5 - 4.6 K/UL    Monocytes Absolute 0.6 0.1 - 1.3 K/UL    Eosinophils Absolute 0.1 0.0 - 0.8 K/UL    Basophils Absolute 0.0 0.0 - 0.2 K/UL    Immature Granulocytes Absolute 0.0 0.0 - 0.5 K/UL   CMP   Result Value Ref Range    Sodium 139 136 - 145 mmol/L    Potassium 4.6 3.5 - 5.1 mmol/L    Chloride 102 98 - 107 mmol/L    CO2 27 20 - 28 mmol/L    Anion Gap 10 9 - 18 mmol/L    Glucose 94 70 - 99 mg/dL    BUN 12 6 - 23 MG/DL    Creatinine 5.62 1.30 - 1.30 MG/DL    Est, Glom Filt Rate 83 >60 ml/min/1.56m2    Calcium 9.6 8.8 - 10.2 MG/DL    Total Bilirubin 0.6 0.0 - 1.2 MG/DL    ALT 23 12 - 65 U/L    AST 20 15 - 37 U/L    Alk Phosphatase 83 40 - 129 U/L    Total Protein 7.3 6.3 - 8.2 g/dL    Albumin 4.3 3.5 - 5.0 g/dL    Globulin 3.0 2.3 - 3.5 g/dL    Albumin/Globulin Ratio 1.4 1.0 - 1.9     CK   Result Value Ref Range    Total CK 164 21 - 215 U/L   Troponin   Result Value Ref Range    Troponin T 8.0 0 - 22 ng/L   Troponin   Result Value Ref Range    Troponin T 7.0 0 - 22 ng/L   POCT Urinalysis no Micro   Result Value Ref Range    Specific Gravity, Urine, POC 1.010 1.001 - 1.023      pH, Urine, POC 7.0 5.0 - 9.0      Protein, Urine, POC Negative NEG mg/dL    Glucose, UA POC 865 (A) NEG mg/dL    Ketones, Urine, POC Negative NEG mg/dL    Bilirubin, Urine, POC Negative NEG      Blood, UA POC Trace Intact (A) NEG      URINE UROBILINOGEN POC 0.2 0.2 - 1.0 EU/dL    Nitrite, Urine, POC Negative NEG      Leukocyte Est, UA POC Negative NEG      Performed by: Dennie Bible    EKG 12 Lead   Result Value Ref Range    Ventricular Rate 69 BPM  Atrial Rate 69 BPM    P-R Interval 189 ms    QRS Duration 86 ms    Q-T Interval 365 ms    QTc Calculation (Bazett) 391 ms    P Axis 70 degrees    R Axis 78 degrees    T Axis 66 degrees    Diagnosis       Sinus rhythm  ST elev, probable normal early repol pattern    Confirmed by  DeNazareth MD, Greig Castilla [id number]) on 02/25/2023 2:10:18 PM           XR CHEST (2 VW)   Final Result   No acute findings in the chest         Electronically signed by Gifford Shave                   No results for input(s): "COVID19" in the last 72 hours.     Voice dictation software was used during the making of this note.  This software is not perfect and grammatical and other typographical errors may be present.  This note has not been completely proofread for errors.       Tish Frederickson, [place]  02/25/23 [phone]

## 2023-02-25 NOTE — ED Notes (Signed)
Orthostatic Vitals:      02/25/2023     1:52 PM   Orthostatic Vitals   Orthostatic B/P and Pulse? Yes   Blood Pressure Lying 139/85   Pulse Lying 68 PER MINUTE   Blood Pressure Sitting 154/80   Pulse Sitting 72 PER MINUTE   Blood Pressure Standing 135/83   Pulse Standing 77 PER MINUTE     Pt dizzy transferring from laying to sitting, dizziness increased from sitting to standing.
